# Patient Record
Sex: Male | Born: 1960 | Race: White | Hispanic: No | Marital: Single | Smoking: Former smoker
Health system: Southern US, Community
[De-identification: ages and names within clinical notes are randomized; demographics above are authoritative.]

## PROBLEM LIST (undated history)

## (undated) DIAGNOSIS — C61 Malignant neoplasm of prostate: Secondary | ICD-10-CM

## (undated) DIAGNOSIS — E785 Hyperlipidemia, unspecified: Secondary | ICD-10-CM

## (undated) DIAGNOSIS — G4700 Insomnia, unspecified: Secondary | ICD-10-CM

## (undated) DIAGNOSIS — M19012 Primary osteoarthritis, left shoulder: Secondary | ICD-10-CM

## (undated) DIAGNOSIS — M19011 Primary osteoarthritis, right shoulder: Secondary | ICD-10-CM

## (undated) DIAGNOSIS — F419 Anxiety disorder, unspecified: Secondary | ICD-10-CM

## (undated) DIAGNOSIS — L719 Rosacea, unspecified: Secondary | ICD-10-CM

## (undated) DIAGNOSIS — K219 Gastro-esophageal reflux disease without esophagitis: Secondary | ICD-10-CM

## (undated) DIAGNOSIS — H269 Unspecified cataract: Secondary | ICD-10-CM

## (undated) HISTORY — DX: Rosacea, unspecified: L71.9

## (undated) HISTORY — DX: Unspecified cataract: H26.9

## (undated) HISTORY — PX: EYE MUSCLE SURGERY: SHX370

## (undated) HISTORY — PX: POLYPECTOMY: SHX149

## (undated) HISTORY — DX: Insomnia, unspecified: G47.00

## (undated) HISTORY — DX: Primary osteoarthritis, left shoulder: M19.012

## (undated) HISTORY — DX: Primary osteoarthritis, right shoulder: M19.011

## (undated) HISTORY — PX: COLONOSCOPY: SHX174

## (undated) HISTORY — DX: Gastro-esophageal reflux disease without esophagitis: K21.9

## (undated) HISTORY — DX: Hyperlipidemia, unspecified: E78.5

## (undated) HISTORY — PX: PROSTATE BIOPSY: SHX241

## (undated) HISTORY — DX: Anxiety disorder, unspecified: F41.9

---

## 1993-02-28 HISTORY — PX: INGUINAL HERNIA REPAIR: SHX194

## 2011-07-14 ENCOUNTER — Encounter: Payer: Self-pay | Admitting: Gastroenterology

## 2011-08-12 ENCOUNTER — Ambulatory Visit (AMBULATORY_SURGERY_CENTER): Payer: 59 | Admitting: *Deleted

## 2011-08-12 ENCOUNTER — Encounter: Payer: Self-pay | Admitting: Gastroenterology

## 2011-08-12 VITALS — Ht 72.0 in | Wt 173.0 lb

## 2011-08-12 DIAGNOSIS — Z1211 Encounter for screening for malignant neoplasm of colon: Secondary | ICD-10-CM

## 2011-08-12 MED ORDER — MOVIPREP 100 G PO SOLR: ORAL | Status: DC

## 2011-08-26 ENCOUNTER — Encounter: Payer: Self-pay | Admitting: Gastroenterology

## 2011-08-26 ENCOUNTER — Ambulatory Visit (AMBULATORY_SURGERY_CENTER): Payer: 59 | Admitting: Gastroenterology

## 2011-08-26 VITALS — BP 124/71 | HR 64 | Temp 97.6°F | Resp 18 | Ht 72.0 in | Wt 173.0 lb

## 2011-08-26 DIAGNOSIS — D126 Benign neoplasm of colon, unspecified: Secondary | ICD-10-CM

## 2011-08-26 DIAGNOSIS — Z1211 Encounter for screening for malignant neoplasm of colon: Secondary | ICD-10-CM

## 2011-08-26 MED ORDER — SODIUM CHLORIDE 0.9 % IV SOLN: 500.0000 mL | INTRAVENOUS | Status: DC

## 2011-08-26 NOTE — Patient Instructions (Addendum)

## 2011-08-26 NOTE — Progress Notes (Signed)
Patient did not experience any of the following events: a burn prior to discharge; a fall within the facility; wrong site/side/patient/procedure/implant event; or a hospital transfer or hospital admission upon discharge from the facility. (G8907) Patient did not have preoperative order for IV antibiotic SSI prophylaxis. (G8918)  

## 2011-08-26 NOTE — Progress Notes (Deleted)
YOU HAD AN ENDOSCOPIC PROCEDURE TODAY AT THE [facility]: Refer to the procedure report that was given to you for any specific questions about what was found during the examination.  If the procedure report does not answer your questions, please call your gastroenterologist to clarify.  If you requested that your care partner not be given the details of your procedure findings, then the procedure report has been included in a sealed envelope for you to review at your convenience later.  YOU SHOULD EXPECT: Some feelings of bloating in the abdomen. Passage of more gas than usual.  Walking can help get rid of the air that was put into your GI tract during the procedure and reduce the bloating. If you had a lower endoscopy (such as a colonoscopy or flexible sigmoidoscopy) you may notice spotting of blood in your stool or on the toilet paper. If you underwent a bowel prep for your procedure, then you may not have a normal bowel movement for a few days.  DIET: Your first meal following the procedure should be a light meal and then it is ok to progress to your normal diet.  A half-sandwich or bowl of soup is an example of a good first meal.  Heavy or fried foods are harder to digest and may make you feel nauseous or bloated.  Likewise meals heavy in dairy and vegetables can cause extra gas to form and this can also increase the bloating.  Drink plenty of fluids but you should avoid alcoholic beverages for 24 hours.  ACTIVITY: Your care partner should take you home directly after the procedure.  You should plan to take it easy, moving slowly for the rest of the day.  You can resume normal activity the day after the procedure however you should NOT DRIVE or use heavy machinery for 24 hours (because of the sedation medicines used during the test).    SYMPTOMS TO REPORT IMMEDIATELY: A gastroenterologist can be reached at any hour.  During normal business hours, 8:30 AM to 5:00 PM Monday through Friday,  call [phone].  After hours and on weekends, please call the GI answering service at [phone] who will take a message and have the physician on call contact you.   Following lower endoscopy (colonoscopy or flexible sigmoidoscopy):  Excessive amounts of blood in the stool  Significant tenderness or worsening of abdominal pains  Swelling of the abdomen that is new, acute  Fever of 100F or higher   FOLLOW UP: If any biopsies were taken you will be contacted by phone or by letter within the next 1-3 weeks.  Call your gastroenterologist if you have not heard about the biopsies in 3 weeks.  Our staff will call the home number listed on your records the next business day following your procedure to check on you and address any questions or concerns that you may have at that time regarding the information given to you following your procedure. This is a courtesy call and so if there is no answer at the home number and we have not heard from you through the emergency physician on call, we will assume that you have returned to your regular daily activities without incident.  SIGNATURES/CONFIDENTIALITY: You and/or your care partner have signed paperwork which will be entered into your electronic medical record.  These signatures attest to the fact that that the information above on your After Visit Summary has been reviewed and is understood.  Full responsibility of the confidentiality of   this discharge information lies with you and/or your care-partner.   Resume your normal medications 

## 2011-08-26 NOTE — Op Note (Signed)
[facility] [address] [place], [place]  [address]  COLONOSCOPY PROCEDURE REPORT  PATIENT:  Charles Noble, Charles Noble  MR#:  [id number] BIRTHDATE:  1960-05-04, 51 yrs. old  GENDER:  male ENDOSCOPIST:  Barbette Hair. Arlyce Dice, MD REF. BY:  Jarome Matin, M.D. PROCEDURE DATE:  08/26/2011 PROCEDURE:  Colonoscopy with snare polypectomy ASA CLASS:  Class I INDICATIONS:  Routine Risk Screening MEDICATIONS:   MAC sedation, administered by CRNA propofol 400mg IV  DESCRIPTION OF PROCEDURE:   After the risks benefits and alternatives of the procedure were thoroughly explained, informed consent was obtained.  Digital rectal exam was performed and revealed no abnormalities.   The LB CF-[id number] endoscope was introduced through the anus and advanced to the cecum, which was identified by both the appendix and ileocecal valve, without limitations.  The quality of the prep was excellent, using MiraLax.  The instrument was then slowly withdrawn as the colon was fully examined. <<PROCEDUREIMAGES>>  FINDINGS:  A sessile polyp was found in the descending colon. It was 5 mm in size. Polyp was snared without cautery. Retrieval was successful (see image3). snare polyp  A sessile polyp was found in the sigmoid colon. It was 5 mm in size. It was found 20 cm from the point of entry. Polyp was snared without cautery. Retrieval was successful (see image6). snare polyp  This was otherwise a normal examination of the colon (see image1 and image7). Retroflexed views in the rectum revealed no abnormalities.    The time to cecum =  1) 4.25  minutes. The scope was then withdrawn in 1) 8.25  minutes from the cecum and the procedure completed. COMPLICATIONS:  None ENDOSCOPIC IMPRESSION: 1) 5 mm sessile polyp in the descending colon 2) 5 mm sessile polyp in the sigmoid colon 3) Otherwise normal examination RECOMMENDATIONS: 1) If the polyp(s) removed today are proven to be adenomatous (pre-cancerous) polyps,  you will need a repeat colonoscopy in 5 years. Otherwise you should continue to follow colorectal cancer screening guidelines for "routine risk" patients with colonoscopy in 10 years. You will receive a letter within 1-2 weeks with the results of your biopsy as well as final recommendations. Please call my office if you have not received a letter after 3 weeks. REPEAT EXAM:   You will receive a letter from Dr. Arlyce Dice in 1-2 weeks, after reviewing the final pathology, with followup recommendations.  ______________________________ Barbette Hair Arlyce Dice, MD  CC:  n. eSIGNED:   Barbette Hair. Derwood Becraft at 08/26/2011 11:13 AM  Eveline Keto, [phone]

## 2011-08-26 NOTE — Progress Notes (Signed)
The pt tolerated the colonoscopy very well. Maw   

## 2011-08-29 ENCOUNTER — Telehealth: Payer: Self-pay | Admitting: *Deleted

## 2011-08-29 NOTE — Telephone Encounter (Signed)
  Follow up Call-  Call back number 08/26/2011  Post procedure Call Back phone  # [phone]  Permission to leave phone message Yes     No answer,left message.

## 2011-09-05 ENCOUNTER — Encounter: Payer: Self-pay | Admitting: Gastroenterology

## 2011-09-07 ENCOUNTER — Encounter: Payer: Self-pay | Admitting: *Deleted

## 2012-02-29 HISTORY — PX: SHOULDER SURGERY: SHX246

## 2016-03-31 DIAGNOSIS — Z0000 Encounter for general adult medical examination without abnormal findings: Secondary | ICD-10-CM | POA: Diagnosis not present

## 2016-04-05 DIAGNOSIS — R972 Elevated prostate specific antigen [PSA]: Secondary | ICD-10-CM | POA: Diagnosis not present

## 2016-04-05 DIAGNOSIS — Z0000 Encounter for general adult medical examination without abnormal findings: Secondary | ICD-10-CM | POA: Diagnosis not present

## 2016-04-05 DIAGNOSIS — Z1389 Encounter for screening for other disorder: Secondary | ICD-10-CM | POA: Diagnosis not present

## 2016-04-11 DIAGNOSIS — Z1212 Encounter for screening for malignant neoplasm of rectum: Secondary | ICD-10-CM | POA: Diagnosis not present

## 2016-06-13 ENCOUNTER — Encounter: Payer: Self-pay | Admitting: Gastroenterology

## 2016-06-30 DIAGNOSIS — H25813 Combined forms of age-related cataract, bilateral: Secondary | ICD-10-CM | POA: Diagnosis not present

## 2016-06-30 DIAGNOSIS — H353121 Nonexudative age-related macular degeneration, left eye, early dry stage: Secondary | ICD-10-CM | POA: Diagnosis not present

## 2016-06-30 DIAGNOSIS — H35412 Lattice degeneration of retina, left eye: Secondary | ICD-10-CM | POA: Diagnosis not present

## 2016-08-01 DIAGNOSIS — H2511 Age-related nuclear cataract, right eye: Secondary | ICD-10-CM | POA: Diagnosis not present

## 2016-08-08 ENCOUNTER — Ambulatory Visit (AMBULATORY_SURGERY_CENTER): Payer: Self-pay | Admitting: *Deleted

## 2016-08-08 VITALS — Ht 72.0 in | Wt 178.0 lb

## 2016-08-08 DIAGNOSIS — Z86010 Personal history of colonic polyps: Secondary | ICD-10-CM

## 2016-08-08 MED ORDER — NA SULFATE-K SULFATE-MG SULF 17.5-3.13-1.6 GM/177ML PO SOLN: 1.0000 | Freq: Once | ORAL | 0 refills | Status: AC

## 2016-08-08 NOTE — Progress Notes (Signed)
No egg or soy allergy known to patient  No issues with past sedation with any surgeries  or procedures, no intubation problems  No diet pills per patient No home 02 use per patient  No blood thinners per patient  Pt denies issues with constipation  No A fib or A flutter  EMMI video declined-  15$ coupon to pt for suprep

## 2016-08-12 ENCOUNTER — Encounter: Payer: Self-pay | Admitting: Gastroenterology

## 2016-08-15 ENCOUNTER — Encounter: Payer: Self-pay | Admitting: Gastroenterology

## 2016-08-15 DIAGNOSIS — H25811 Combined forms of age-related cataract, right eye: Secondary | ICD-10-CM | POA: Diagnosis not present

## 2016-08-15 DIAGNOSIS — H2511 Age-related nuclear cataract, right eye: Secondary | ICD-10-CM | POA: Diagnosis not present

## 2016-08-26 ENCOUNTER — Encounter: Payer: Self-pay | Admitting: Gastroenterology

## 2016-08-26 ENCOUNTER — Ambulatory Visit (AMBULATORY_SURGERY_CENTER): Payer: 59 | Admitting: Gastroenterology

## 2016-08-26 VITALS — BP 115/72 | HR 72 | Temp 98.0°F | Resp 20 | Ht 72.0 in | Wt 178.0 lb

## 2016-08-26 DIAGNOSIS — Z86010 Personal history of colonic polyps: Secondary | ICD-10-CM | POA: Diagnosis not present

## 2016-08-26 DIAGNOSIS — D123 Benign neoplasm of transverse colon: Secondary | ICD-10-CM | POA: Diagnosis not present

## 2016-08-26 DIAGNOSIS — D122 Benign neoplasm of ascending colon: Secondary | ICD-10-CM | POA: Diagnosis not present

## 2016-08-26 DIAGNOSIS — Z1211 Encounter for screening for malignant neoplasm of colon: Secondary | ICD-10-CM | POA: Diagnosis not present

## 2016-08-26 MED ORDER — SODIUM CHLORIDE 0.9 % IV SOLN: 500.0000 mL | INTRAVENOUS | Status: AC

## 2016-08-26 NOTE — Progress Notes (Signed)
Called to room to assist during endoscopic procedure.  Patient ID and intended procedure confirmed with present staff. Received instructions for my participation in the procedure from the performing physician.  

## 2016-08-26 NOTE — Patient Instructions (Signed)
   INFORMATION ON POLYPS,DIVERTICULOSIS,AND HEMORRHOIDS GIVEN TO YOU TODAY.  AWAIT PATHOLOGY RESULTS IN A LETTER FROM DR NANDIGAM    YOU HAD AN ENDOSCOPIC PROCEDURE TODAY AT [facility]:   Refer to the procedure report that was given to you for any specific questions about what was found during the examination.  If the procedure report does not answer your questions, please call your gastroenterologist to clarify.  If you requested that your care partner not be given the details of your procedure findings, then the procedure report has been included in a sealed envelope for you to review at your convenience later.  YOU SHOULD EXPECT: Some feelings of bloating in the abdomen. Passage of more gas than usual.  Walking can help get rid of the air that was put into your GI tract during the procedure and reduce the bloating. If you had a lower endoscopy (such as a colonoscopy or flexible sigmoidoscopy) you may notice spotting of blood in your stool or on the toilet paper. If you underwent a bowel prep for your procedure, you may not have a normal bowel movement for a few days.  Please Note:  You might notice some irritation and congestion in your nose or some drainage.  This is from the oxygen used during your procedure.  There is no need for concern and it should clear up in a day or so.  SYMPTOMS TO REPORT IMMEDIATELY:   Following lower endoscopy (colonoscopy or flexible sigmoidoscopy):  Excessive amounts of blood in the stool  Significant tenderness or worsening of abdominal pains  Swelling of the abdomen that is new, acute  Fever of 100F or higher    For urgent or emergent issues, a gastroenterologist can be reached at any hour by calling [phone].   DIET:  We do recommend a small meal at first, but then you may proceed to your regular diet.  Drink plenty of fluids but you should avoid alcoholic beverages for 24 hours.  ACTIVITY:  You should plan to take it easy for  the rest of today and you should NOT DRIVE or use heavy machinery until tomorrow (because of the sedation medicines used during the test).    FOLLOW UP: Our staff will call the number listed on your records the next business day following your procedure to check on you and address any questions or concerns that you may have regarding the information given to you following your procedure. If we do not reach you, we will leave a message.  However, if you are feeling well and you are not experiencing any problems, there is no need to return our call.  We will assume that you have returned to your regular daily activities without incident.  If any biopsies were taken you will be contacted by phone or by letter within the next 1-3 weeks.  Please call us at [phone] if you have not heard about the biopsies in 3 weeks.    SIGNATURES/CONFIDENTIALITY: You and/or your care partner have signed paperwork which will be entered into your electronic medical record.  These signatures attest to the fact that that the information above on your After Visit Summary has been reviewed and is understood.  Full responsibility of the confidentiality of this discharge information lies with you and/or your care-partner.

## 2016-08-26 NOTE — Progress Notes (Signed)
Pt's states no medical or surgical changes since previsit or office visit. 

## 2016-08-26 NOTE — Progress Notes (Signed)
A/ox3 pleased with MAC, report to [facility]

## 2016-08-26 NOTE — Op Note (Signed)
[facility] Patient Name: Charles Noble Procedure Date: 08/26/2016 10:24 AM MRN: [id number] Endoscopist: Mauri Pole , MD Age: 56 Referring MD:  Date of Birth: [birth year] Gender: Male Account #: [id number] Procedure:                Colonoscopy Indications:              High risk colon cancer surveillance: Personal                            history of adenoma less than 10 mm in size Medicines:                Monitored Anesthesia Care Procedure:                Pre-Anesthesia Assessment:                           - Prior to the procedure, a History and Physical                            was performed, and patient medications and                            allergies were reviewed. The patient's tolerance of                            previous anesthesia was also reviewed. The risks                            and benefits of the procedure and the sedation                            options and risks were discussed with the patient.                            All questions were answered, and informed consent                            was obtained. Prior Anticoagulants: The patient has                            taken no previous anticoagulant or antiplatelet                            agents. ASA Grade Assessment: II - A patient with                            mild systemic disease. After reviewing the risks                            and benefits, the patient was deemed in                            satisfactory condition to undergo the procedure.  After obtaining informed consent, the colonoscope                            was passed under direct vision. Throughout the                            procedure, the patient's blood pressure, pulse, and                            oxygen saturations were monitored continuously. The                            Colonoscope was introduced through the anus and                            advanced to the the  cecum, identified by                            appendiceal orifice and ileocecal valve. The                            colonoscopy was performed without difficulty. The                            patient tolerated the procedure well. The quality                            of the bowel preparation was excellent. The                            ileocecal valve, appendiceal orifice, and rectum                            were photographed. Scope In: 10:44:27 AM Scope Out: 11:05:50 AM Scope Withdrawal Time: 0 hours 12 minutes 16 seconds  Total Procedure Duration: 0 hours 21 minutes 23 seconds  Findings:                 The perianal and digital rectal examinations were                            normal.                           A 1 mm polyp was found in the ascending colon. The                            polyp was sessile. The polyp was removed with a                            cold biopsy forceps. Resection and retrieval were                            complete.  A 5 mm polyp was found in the transverse colon. The                            polyp was sessile. The polyp was removed with a                            cold snare. Resection and retrieval were complete.                           Multiple small-mouthed diverticula were found in                            the sigmoid colon and descending colon.                           Non-bleeding internal hemorrhoids were found during                            retroflexion. The hemorrhoids were small. Complications:            No immediate complications. Estimated Blood Loss:     Estimated blood loss was minimal. Impression:               - One 1 mm polyp in the ascending colon, removed                            with a cold biopsy forceps. Resected and retrieved.                           - One 5 mm polyp in the transverse colon, removed                            with a cold snare. Resected and retrieved.                            - Diverticulosis in the sigmoid colon and in the                            descending colon.                           - Non-bleeding internal hemorrhoids. Recommendation:           - Patient has a contact number available for                            emergencies. The signs and symptoms of potential                            delayed complications were discussed with the                            patient. Return to normal activities tomorrow.  Written discharge instructions were provided to the                            patient.                           - Resume previous diet.                           - Continue present medications.                           - Await pathology results.                           - Repeat colonoscopy in 5 years for surveillance                            based on pathology results. Mauri Pole, MD 08/26/2016 11:09:53 AM This report has been signed electronically.

## 2016-08-29 ENCOUNTER — Telehealth: Payer: Self-pay | Admitting: *Deleted

## 2016-08-29 NOTE — Telephone Encounter (Signed)
Left message on f/u call 

## 2016-08-29 NOTE — Telephone Encounter (Signed)
No answer, message left for the patient. 

## 2016-08-30 ENCOUNTER — Encounter: Payer: Self-pay | Admitting: Gastroenterology

## 2016-09-20 ENCOUNTER — Encounter: Payer: Self-pay | Admitting: Neurology

## 2016-09-21 ENCOUNTER — Encounter: Payer: Self-pay | Admitting: Neurology

## 2016-09-21 ENCOUNTER — Ambulatory Visit (INDEPENDENT_AMBULATORY_CARE_PROVIDER_SITE_OTHER): Payer: 59 | Admitting: Neurology

## 2016-09-21 VITALS — BP 122/78 | HR 68 | Ht 72.0 in | Wt 177.0 lb

## 2016-09-21 DIAGNOSIS — R4189 Other symptoms and signs involving cognitive functions and awareness: Secondary | ICD-10-CM | POA: Diagnosis not present

## 2016-09-21 DIAGNOSIS — G4709 Other insomnia: Secondary | ICD-10-CM | POA: Diagnosis not present

## 2016-09-21 NOTE — Progress Notes (Signed)
SLEEP MEDICINE CLINIC   Provider:  Larey Seat, M D  Primary Care Physician:  Leanna Battles, MD   Referring Provider: Leanna Battles, MD    Chief Complaint  Patient presents with  . New Patient (Initial Visit)    HPI:  Charles Noble is a 56 y.o. male , seen here as in a referral/ revisit  from Dr. Philip Aspen for a sleep evaluation.   Charles Noble reports that he has had difficulties initiating sleep even as a young man and maybe even in childhood. And during his younger years he had no problems compensating for it. He could sleep longer on weekends and to sleep was uninterrupted are unfragmented once initiated. Over the last couple of years he has woken up often between 3:34 AM and finds himself not able to easily go back to sleep, the rest of the night is often spent in a fitful nonrestorative sleep quality. Charles Noble has also recently been referred for evaluation of adult ADHD or ADD, and he seems to agree that this may be present. He was tried on extended-release stimulants in 2 different forms, but the most recent prescribed medication is generic Ritalin 10 mg offered as twice a day medication. He has not taken it in over a month but he has noted that he is more focused and he loses weight and on this medication.   Chief complaint according to patient : Insomnia- getting 4 hours of sleep and than "little sleep " at all.   Sleep habits are as follows: The patient's desired bedtime is around 10:30 PM, he reads on his kindle , and is asleep within 20-30 minutes. The benefit is that he can enlarge the fond. He takes Ambien or Lunesta 3 mg, Zolpidem 1/ 2 of 10 mg.Asleep sleep about 11:00 he will be waking up frequently after the time of 2.30 or 3.30 - after 3 hours of sleep. He has no parasomnia history. No nocturia. Is not sure if he dreams but dreamt very active dreams on Chantix. The bedroom is described as cool, quiet and dark, he is moving frequently, also indicated by his fit bit.  He falls asleep on his side but wakes up on his back. He has not been told that he snores- sleeps alone mostly. He rises at 6 AM, alert , without alarm. Sleep better after Ambien, more groggy after Lunesta.  He tried OTC, Benadryl, Melatonin and Unisom. He does not take daytime naps.     Sleep medical history and family sleep history: older sister has insomnia on [place], mother took [place]. Maternal family history of depression, affecting the patient himself.   Social history:  Single , childless. Works feels overwhelming, no extracurricular activities. 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  As a student fell asleep in classroom, struggles in meetings now. Smoked on and off- currently 2 month off . Drinks some alcohol- wine or beer, usually not hard liquor. Week ends . Caffeine;  All coffee and coke in AM.     Review of Systems: Out of a complete 14 system review, the patient complains of only the following symptoms, and all other reviewed systems are negative.  sleepy on long rides.  shoulder pain on the right - dominant hand. Affected sleep position.  Epworth score  2 Fatigue severity score 48  , depression score 2/15   Social History  Social History  . Marital status: Single    Spouse name: N/A  . Number of children: N/A  . Years of education: N/A   Occupational History  . Not on file.   Social History Main Topics  . Smoking status: Former Smoker    Quit date: 09/29/2010  . Smokeless tobacco: Never Used     Comment: uses e-cigarette- not presently   . Alcohol use 6.0 oz/week    5 Glasses of wine, 5 Cans of beer per week     Comment: social  . Drug use: No  . Sexual activity: Not on file   Other Topics Concern  . Not on file   Social History Narrative  . No narrative on file    Family History  Problem Relation Age of Onset  . Colon polyps  Brother   . Ovarian cancer Mother   . Diabetes Father   . Breast cancer Sister   . Esophageal cancer Neg Hx   . Rectal cancer Neg Hx   . Stomach cancer Neg Hx   . Colon cancer Neg Hx     Past Medical History:  Diagnosis Date  . Anxiety   . Cataract   . GERD (gastroesophageal reflux disease)   . Hyperlipidemia   . Insomnia   . Osteoarthritis of both shoulders   . Rosacea     Past Surgical History:  Procedure Laterality Date  . COLONOSCOPY    . [facility]  . [place]   right  . POLYPECTOMY    . SHOULDER SURGERY  2014    Current Outpatient Prescriptions  Medication Sig Dispense Refill  . acyclovir (ZOVIRAX) 400 MG tablet Take 400 mg by mouth 2 (two) times daily.    Marland Kitchen ALPRAZolam (XANAX) 0.25 MG tablet Take 0.25 mg by mouth at bedtime as needed for anxiety.    Marland Kitchen Besifloxacin HCl (BESIVANCE) 0.6 % SUSP Apply to eye.    . Bromfenac Sodium (PROLENSA) 0.07 % SOLN Apply to eye.    . Difluprednate (DUREZOL) 0.05 % EMUL Apply to eye.    . Eszopiclone 3 MG TABS Take 3 mg by mouth at bedtime. Take immediately before bedtime    . Ibuprofen 200 MG CAPS Take 2 capsules by mouth as needed.    . methylphenidate (RITALIN) 10 MG tablet Take 10 mg by mouth 2 (two) times daily with breakfast and lunch.    . METROGEL 1 % gel Apply 1 application topically Twice daily as needed.    . minocycline (MINOCIN,DYNACIN) 100 MG capsule Take 1 capsule by mouth as needed.    Marland Kitchen omeprazole (PRILOSEC) 20 MG capsule Take 20 mg by mouth daily as needed.    . simvastatin (ZOCOR) 80 MG tablet Take 1 tablet by mouth Daily.    . tadalafil (CIALIS) 10 MG tablet Take 5 mg by mouth daily as needed for erectile dysfunction.     Marland Kitchen zolpidem (AMBIEN) 10 MG tablet Take 1 tablet by mouth At bedtime as needed.     Current Facility-Administered Medications  Medication Dose Route Frequency Provider Last Rate Last Dose  . 0.9 %  sodium chloride infusion  500 mL Intravenous Continuous  Mauri Pole, MD        Allergies as of 09/21/2016  . (No Known Allergies)    Vitals: BP 122/78   Pulse 68   Ht 6' (1.829 m)   Wt 177 lb (80.3 kg)   BMI 24.01 kg/m  Last Weight:  Wt Readings from Last 1 Encounters:  09/21/16 177 lb (80.3 kg)   KDT:OIZT mass index is 24.01 kg/m.     Last Height:   Ht Readings from Last 1 Encounters:  09/21/16 6' (1.829 m)    Physical exam:  General: The patient is awake, alert and appears not in acute distress. The patient is well groomed. Head: Normocephalic, atraumatic. Neck is supple. Mallampati 1-the uvula lifts promptly above the tongue ground no retrognathia is noted, or clicking , used braces in childhood.  neck circumference:16. Nasal airflow - Patent  Cardiovascular:  Regular rate and rhythm, without  murmurs or carotid bruit, and without distended neck veins. Respiratory: Lungs are clear to auscultation. Skin:  Without evidence of edema, or rash Trunk: BMI is 24 . The patient's posture is erect  Neurologic exam : The patient is awake and alert, oriented to place and time.   Attention span & concentration ability appears normal.  Speech is fluent,  without dysarthria, dysphonia or aphasia. Mood and affect are appropriate.  Cranial nerves: Pupils are equal and briskly reactive to light. Funduscopic exam without  evidence of pallor or edema. Status [ost cataract surgery on the right,  Extraocular movements  in vertical and horizontal planes intact and without nystagmus. Visual fields by finger perimetry are intact.Hearing to finger rub intact.  Facial sensation intact to fine touch. Facial motor strength is symmetric and tongue and uvula move midline. Shoulder shrug was symmetrical.   Motor exam:  Normal tone, muscle bulk and symmetric strength in all extremities. Sensory:  Fine touch, pinprick and vibration were tested in all extremities. Proprioception tested in the upper extremities was normal. Coordination: Rapid  alternating movements in the fingers/hands was normal. Finger-to-nose maneuver  normal without evidence of ataxia, dysmetria or tremor. Gait and station: Patient walks without assistive device and is able unassisted to climb up to the exam table. Strength within normal limits.  Stance is stable and normal.   Deep tendon reflexes: in the  upper and lower extremities are symmetric and intact. Babinski maneuver response is downgoing.    Assessment:  After physical and neurologic examination, review of laboratory studies,  Personal review of imaging studies, reports of other /same  Imaging studies, results of polysomnography and / or neurophysiology testing and pre-existing records as far as provided in visit., my assessment is   1)   Insomnia related to stress. He can improve on sleep hygiene. Alcohol reduction.  Ambien to 5 mg 4 days a week- may try trazodone 3 nights a week.    2)   He has no known apnea or even snoring. He has vivid dreams and is not sure if these are med induced. He has not sleepwalked.   3)  I don't think he benefits form a HST- Insomnia cannot be qualified by HST- I would like to witness his arousals in early AM - and see if hey have a physiological reason.    The patient was advised of the nature of the diagnosed disorder , the treatment options and the  risks for general health and wellness arising from not treating the condition.   I spent more than 45 minutes of face to face time with the patient.  Greater than 50% of time was spent in counseling and coordination of care. We have discussed the diagnosis and differential and I answered the patient's questions.    Plan:  Treatment plan and additional workup :  I will ask [employer] to allow  a in lab study.  Larey Seat, MD 2/84/0698, 6:14 AM  Certified in Neurology by ABPN Certified in [place] by [facility] [address], [place], [place] [address]

## 2016-09-27 ENCOUNTER — Telehealth: Payer: Self-pay | Admitting: Neurology

## 2016-09-27 DIAGNOSIS — F5103 Paradoxical insomnia: Secondary | ICD-10-CM

## 2016-09-27 NOTE — Telephone Encounter (Signed)
[employer] denied NPSG suggest HST

## 2016-09-28 ENCOUNTER — Other Ambulatory Visit: Payer: Self-pay | Admitting: Neurology

## 2016-09-28 DIAGNOSIS — G4733 Obstructive sleep apnea (adult) (pediatric): Secondary | ICD-10-CM

## 2016-09-28 NOTE — Addendum Note (Signed)
Addended by: Larey Seat on: 09/28/2016 04:53 PM   Modules accepted: Orders

## 2016-09-28 NOTE — Telephone Encounter (Signed)
Insomnia cannot be evaluated by HST- it can only rule out apnea.

## 2016-10-10 DIAGNOSIS — R972 Elevated prostate specific antigen [PSA]: Secondary | ICD-10-CM | POA: Diagnosis not present

## 2016-10-12 ENCOUNTER — Ambulatory Visit (INDEPENDENT_AMBULATORY_CARE_PROVIDER_SITE_OTHER): Payer: 59 | Admitting: Neurology

## 2016-10-12 DIAGNOSIS — G4733 Obstructive sleep apnea (adult) (pediatric): Secondary | ICD-10-CM | POA: Diagnosis not present

## 2016-10-12 DIAGNOSIS — F5103 Paradoxical insomnia: Secondary | ICD-10-CM

## 2016-10-20 NOTE — Addendum Note (Signed)
Addended by: Larey Seat on: 10/20/2016 06:19 PM   Modules accepted: Orders

## 2016-10-20 NOTE — Procedures (Signed)
[facility] [place],  [id number] NAME:  Charles Noble                 DOB: [birth year] MEDICAL RECORD [id number]   DOS: 10/17/16 REFERRING PHYSICIAN: Leanna Battles, MD STUDY PERFORMED: HST HISTORY: Charles Noble is seen here for insomnia, difficulties initiating sleep, even as a young man and even in childhood. Over the last couple of years he has woken up between 3 and 4 AM and finds himself not able to go back to sleep, the rest of the night is often spent in a fitful nonrestorative sleep quality. Epworth score 2, Fatigue severity score 48 points, depression score 2/15, BMI 23.3   STUDY RESULTS: Total Recording Time: 7h 44 min.  Total Apnea/Hypopnea Index (AHI): 1.6/hr. RDI was 3.5/hr.  Average Oxygen Saturation: 94% Lowest Oxygen Saturation: 89% Average Heart Rate: 58  (48-89 bpm )   IMPRESSION: Mild snoring but no significant sleep apnea noted, no explanation for the patient's insomnia complaint.    RECOMMENDATION: I will ask for an in lab PSG to evaluate nocturnal movements and reason's for arousals. I had requested an in lab study before and will do this again now. IF the PSG in Lab can not find a physiological sleep disorder, we will need to refer to insomnia counseling.  I certify that I have reviewed the raw data recording prior to the issuance of this report in accordance with the standards of the American Academy of Sleep Medicine (AASM). Larey Seat, MD   10-20-2016   [facility], ABPN and ABSM Member of and accredited by AASM

## 2016-10-21 ENCOUNTER — Telehealth: Payer: Self-pay | Admitting: Neurology

## 2016-10-21 NOTE — Telephone Encounter (Signed)
Called to discuss sleep results with the patient. No answer at this time.LVm for pt to return call.

## 2016-10-21 NOTE — Telephone Encounter (Signed)
-----   Message from Larey Seat, MD sent at 10/20/2016  6:19 PM EDT ----- The HST did not reveal any sign of apnea, cardiac arrhythmia or oxygen desaturation of clinical relevance. It does not explain the early morning arousals.   I had requested an in lab study before and will do this again now. It may not be approved by insurance- we will try.   If the PSG in lab can not find a physiological sleep disorder, or if we cannot get the PSG approval, we will need to refer to insomnia counseling. CD  CC to Dr Philip Aspen, please.

## 2016-11-03 NOTE — Telephone Encounter (Signed)
Called patient to go over sleep study results. LVM for the patient.  I will send a copy in the mail if I have not heard back from the patient today.

## 2016-11-08 ENCOUNTER — Telehealth: Payer: Self-pay | Admitting: Neurology

## 2016-11-08 NOTE — Telephone Encounter (Signed)
Called patient to schedule NPSG however, he is waiting to hear the results of the home sleep study before he will move forward.

## 2016-11-08 NOTE — Telephone Encounter (Signed)
Called the patient to give him the HST results. I explained that no apnea was noted but that Dr Brett Fairy would like to bring the patient in for another sleep study due to wanting to assess the nocturnal movements. Pt was agreeable and has stated that he will come in for sleep study. Pt will await sleep lab to contact him.

## 2016-11-17 ENCOUNTER — Telehealth: Payer: Self-pay | Admitting: Neurology

## 2016-11-17 NOTE — Telephone Encounter (Signed)
We have attempted to call the patient 3 times to schedule sleep study. Patient has been unavailable at the phone numbers we have on file and has not returned our calls. At this point we will send a letter asking pt to please contact the sleep lab to schedule their sleep study. If patient calls back we will schedule them for their sleep study. ° °

## 2016-12-06 ENCOUNTER — Ambulatory Visit (INDEPENDENT_AMBULATORY_CARE_PROVIDER_SITE_OTHER): Payer: 59 | Admitting: Neurology

## 2016-12-06 DIAGNOSIS — F5103 Paradoxical insomnia: Secondary | ICD-10-CM

## 2016-12-06 DIAGNOSIS — G4761 Periodic limb movement disorder: Secondary | ICD-10-CM

## 2016-12-13 NOTE — Procedures (Signed)
PATIENT'S NAME:  Charles Noble, Charles Noble DOB:      Mar 01, 1960      MR#:    [id number]     DATE OF RECORDING: 12/06/2016 REFERRING M.D.:  Leanna Battles MD Study Performed:   Baseline Polysomnogram HISTORY:  Pt had a HST on 10/17/16 with results of mild snoring but no significant sleep apnea noted, no explanation for the patient's insomnia complaint. PSG to evaluate nocturnal movements and reasons for arousals. If this PSG in Lab cannot find a physiological sleep disorder, we will need to refer to insomnia directed cognitive behavior therapy.   Charles Noble is seen here for insomnia, difficulties initiating sleep, even as a young man and even in childhood. Over the last couple of years he has woken up between 3 and 4 AM and finds himself not able to go back to sleep, the rest of the night is often spent in a fitful nonrestorative sleep quality.  The patient endorsed the Epworth Sleepiness Scale at 2 points. FSS at 48. The patient's weight 177 pounds with a height of 72 (inches), resulting in a BMI of 23.9 kg/m2.The patient's neck circumference measured 16 inches.  CURRENT MEDICATIONS: Zovirax; Xanax; Besivance eye; Prolensa; Durezol; Lunesta ; Ritalin; Minocycline; Prilosec; Zocor; Cialis; Ambien   PROCEDURE:  This is a multichannel digital polysomnogram utilizing the Somnostar 11.2 system.  Electrodes and sensors were applied and monitored per AASM Specifications.   EEG, EOG, Chin and Limb EMG, were sampled at 200 Hz.  ECG, Snore and Nasal Pressure, Thermal Airflow, Respiratory Effort, CPAP Flow and Pressure, Oximetry was sampled at 50 Hz. Digital video and audio were recorded.      BASELINE STUDY Lights Out was at 22:39 and Lights On at 05:11.  Total recording time (TRT) was 392 minutes, with a total sleep time (TST) of 362 minutes. The patient's sleep latency was 30 minutes.  REM latency was 2.5 minutes.  The sleep efficiency was 92.3 %.     SLEEP ARCHITECTURE: WASO (Wake after sleep onset) was 3.5 minutes.   There were 6 minutes in Stage N1, 250.5 minutes Stage N2, 26 minutes Stage N3 and 79.5 minutes in Stage REM.  The percentage of Stage N1 was 1.7%, Stage N2 was 69.2%, Stage N3 was 7.2% and Stage R (REM sleep) was 22.%.   RESPIRATORY ANALYSIS:  There were a total of 19 respiratory events:  14 obstructive apneas, 0 central apneas and 0 mixed apneas with a total of 14 apneas and an apnea index (AI) of 2.3 /hour. There were 5 hypopneas with a hypopnea index of .8 /hour. The patient also had 0 respiratory event related arousals (RERAs).     The total APNEA/HYPOPNEA INDEX (AHI) was 3.1/hour and the total RESPIRATORY DISTURBANCE INDEX was 3.1 /hour.  19 events occurred in REM sleep and 0 events in NREM. The REM AHI was 14.3 /hour, versus a non-REM AHI of 0. The patient spent 133.5 minutes of total sleep time in the supine position and 229 minutes in non-supine. The supine AHI was 5.8 versus a non-supine AHI of 1.6.  OXYGEN SATURATION & C02:  The Wake baseline 02 saturation was 94%, with the lowest being 78%. Time spent below 89% saturation equaled 3 minutes.   PERIODIC LIMB MOVEMENTS:   The patient had a total of 96 Periodic Limb Movements.  The Periodic Limb Movement (PLM) index was 15.9 and the PLM Arousal index was 2.5/hour. The arousals were noted as: 73 were spontaneous, 15 were associated with PLMs, and 4 were  associated with respiratory events.    Audio and video analysis did not show any abnormal or unusual movements, behaviors, phonations or vocalizations.  No nocturia, no parasomnia. Snoring was noted. EKG was in keeping with normal sinus rhythm (NSR). Post-study, the patient indicated that sleep was the same as usual.    IMPRESSION: sustained, uninterrupted sleep with normal REM sleep architecture.   1. Mild Periodic Limb Movement Disorder (PLMD) 2. Primary Snoring  RECOMMENDATIONS:  1. There were periodic limb movements of sleep (PLMS) but rarely associated with sleep disruption.   Consider treating the PLMS primarily.  Correlate clinically for a history consistent with regarding restless legs syndrome (RLS).    Consider secondary restless legs syndrome.  .   2. A dedicated sleep psychology referral is indicated for sleep perception disorder. No objective evidence of  insomnia during sleep study.   3. A follow up appointment will be scheduled in the Sleep Clinic at [facility]. The referring provider will be notified of the results.      I certify that I have reviewed the entire raw data recording prior to the issuance of this report in accordance with the Standards of Accreditation of the American Academy of Sleep Medicine (AASM)      Larey Seat, MD    12-13-2016  Diplomat, American Board of Psychiatry and Neurology  Diplomat, American Board of [place] Director, [facility]

## 2016-12-14 ENCOUNTER — Telehealth: Payer: Self-pay | Admitting: Neurology

## 2016-12-14 DIAGNOSIS — B078 Other viral warts: Secondary | ICD-10-CM | POA: Diagnosis not present

## 2016-12-14 DIAGNOSIS — L719 Rosacea, unspecified: Secondary | ICD-10-CM | POA: Diagnosis not present

## 2016-12-14 NOTE — Telephone Encounter (Signed)
-----   Message from Larey Seat, MD sent at 12/13/2016  5:54 PM EDT ----- Sustained sleep with some respiratory events during REM sleep. Normal sleep architecture with  sufficient REM sleep was noted. Patient slept until 5. 00 AM. Mild PLMs - only in the first 2 hours of sleep and not in REM sleep.  We can try to reduce PLMs, but mainly need to explain that this is a fairly normal sleep pattern. Not insomnia.  CD

## 2016-12-14 NOTE — Telephone Encounter (Signed)
Called patient to discuss sleep study results. No answer at this time. LVM for patient to return call

## 2016-12-22 ENCOUNTER — Other Ambulatory Visit: Payer: Self-pay | Admitting: Neurology

## 2016-12-22 DIAGNOSIS — G4700 Insomnia, unspecified: Secondary | ICD-10-CM

## 2016-12-22 NOTE — Addendum Note (Signed)
Addended by: Darleen Crocker on: 12/22/2016 10:12 AM   Modules accepted: Orders

## 2016-12-22 NOTE — Telephone Encounter (Signed)
Pt returned call to me and we discussed that his sleep study showed a normal sleep pattern. Pt did take his sleep medications which was helpful in providing him with a 92% sleep efficiency. The pt states that he needed this sleep study to prove the need for his sleep medications. I informed him that the study can not prove the need for those medications it basically just shows that he slept well with no complications during the study. I offered the cognitive behavior therapy referral for the patient. The patient would like to proceed with the referral.

## 2017-01-24 DIAGNOSIS — H353121 Nonexudative age-related macular degeneration, left eye, early dry stage: Secondary | ICD-10-CM | POA: Diagnosis not present

## 2017-01-24 DIAGNOSIS — H25812 Combined forms of age-related cataract, left eye: Secondary | ICD-10-CM | POA: Diagnosis not present

## 2017-01-24 DIAGNOSIS — H35412 Lattice degeneration of retina, left eye: Secondary | ICD-10-CM | POA: Diagnosis not present

## 2017-01-30 DIAGNOSIS — H2512 Age-related nuclear cataract, left eye: Secondary | ICD-10-CM | POA: Diagnosis not present

## 2017-02-08 DIAGNOSIS — H25812 Combined forms of age-related cataract, left eye: Secondary | ICD-10-CM | POA: Diagnosis not present

## 2017-02-08 DIAGNOSIS — H2512 Age-related nuclear cataract, left eye: Secondary | ICD-10-CM | POA: Diagnosis not present

## 2017-04-06 DIAGNOSIS — Z0000 Encounter for general adult medical examination without abnormal findings: Secondary | ICD-10-CM | POA: Diagnosis not present

## 2017-04-06 DIAGNOSIS — E298 Other testicular dysfunction: Secondary | ICD-10-CM | POA: Diagnosis not present

## 2017-04-06 DIAGNOSIS — R82998 Other abnormal findings in urine: Secondary | ICD-10-CM | POA: Diagnosis not present

## 2017-04-13 DIAGNOSIS — E782 Mixed hyperlipidemia: Secondary | ICD-10-CM | POA: Diagnosis not present

## 2017-04-13 DIAGNOSIS — R972 Elevated prostate specific antigen [PSA]: Secondary | ICD-10-CM | POA: Diagnosis not present

## 2017-04-13 DIAGNOSIS — E298 Other testicular dysfunction: Secondary | ICD-10-CM | POA: Diagnosis not present

## 2017-04-13 DIAGNOSIS — Z1389 Encounter for screening for other disorder: Secondary | ICD-10-CM | POA: Diagnosis not present

## 2017-04-13 DIAGNOSIS — Z0000 Encounter for general adult medical examination without abnormal findings: Secondary | ICD-10-CM | POA: Diagnosis not present

## 2017-04-20 DIAGNOSIS — Z1212 Encounter for screening for malignant neoplasm of rectum: Secondary | ICD-10-CM | POA: Diagnosis not present

## 2017-10-05 DIAGNOSIS — H26493 Other secondary cataract, bilateral: Secondary | ICD-10-CM | POA: Diagnosis not present

## 2017-10-05 DIAGNOSIS — H35412 Lattice degeneration of retina, left eye: Secondary | ICD-10-CM | POA: Diagnosis not present

## 2017-10-05 DIAGNOSIS — H353121 Nonexudative age-related macular degeneration, left eye, early dry stage: Secondary | ICD-10-CM | POA: Diagnosis not present

## 2017-10-26 NOTE — Addendum Note (Signed)
Addended by: Darleen Crocker on: 10/26/2017 02:57 PM   Modules accepted: Orders

## 2018-03-07 DIAGNOSIS — D3709 Neoplasm of uncertain behavior of other specified sites of the oral cavity: Secondary | ICD-10-CM | POA: Diagnosis not present

## 2018-03-12 DIAGNOSIS — K1329 Other disturbances of oral epithelium, including tongue: Secondary | ICD-10-CM | POA: Diagnosis not present

## 2018-04-09 DIAGNOSIS — R82998 Other abnormal findings in urine: Secondary | ICD-10-CM | POA: Diagnosis not present

## 2018-04-09 DIAGNOSIS — Z0000 Encounter for general adult medical examination without abnormal findings: Secondary | ICD-10-CM | POA: Diagnosis not present

## 2018-04-11 DIAGNOSIS — Z1212 Encounter for screening for malignant neoplasm of rectum: Secondary | ICD-10-CM | POA: Diagnosis not present

## 2018-04-16 DIAGNOSIS — Z23 Encounter for immunization: Secondary | ICD-10-CM | POA: Diagnosis not present

## 2018-04-16 DIAGNOSIS — Z0000 Encounter for general adult medical examination without abnormal findings: Secondary | ICD-10-CM | POA: Diagnosis not present

## 2018-04-16 DIAGNOSIS — R972 Elevated prostate specific antigen [PSA]: Secondary | ICD-10-CM | POA: Diagnosis not present

## 2018-04-16 DIAGNOSIS — E782 Mixed hyperlipidemia: Secondary | ICD-10-CM | POA: Diagnosis not present

## 2018-04-16 DIAGNOSIS — E291 Testicular hypofunction: Secondary | ICD-10-CM | POA: Diagnosis not present

## 2018-06-12 DIAGNOSIS — L719 Rosacea, unspecified: Secondary | ICD-10-CM | POA: Diagnosis not present

## 2018-06-12 DIAGNOSIS — B079 Viral wart, unspecified: Secondary | ICD-10-CM | POA: Diagnosis not present

## 2018-12-06 ENCOUNTER — Other Ambulatory Visit: Payer: Self-pay

## 2018-12-06 DIAGNOSIS — Z20822 Contact with and (suspected) exposure to covid-19: Secondary | ICD-10-CM

## 2018-12-07 LAB — NOVEL CORONAVIRUS, NAA: SARS-CoV-2, NAA: NOT DETECTED

## 2018-12-20 ENCOUNTER — Other Ambulatory Visit: Payer: Self-pay

## 2018-12-20 DIAGNOSIS — Z20822 Contact with and (suspected) exposure to covid-19: Secondary | ICD-10-CM

## 2018-12-22 LAB — NOVEL CORONAVIRUS, NAA: SARS-CoV-2, NAA: NOT DETECTED

## 2019-04-10 ENCOUNTER — Ambulatory Visit: Payer: 59 | Attending: Internal Medicine

## 2019-04-10 DIAGNOSIS — Z20822 Contact with and (suspected) exposure to covid-19: Secondary | ICD-10-CM

## 2019-04-11 LAB — NOVEL CORONAVIRUS, NAA: SARS-CoV-2, NAA: NOT DETECTED

## 2019-06-24 ENCOUNTER — Encounter: Payer: Self-pay | Admitting: Radiation Oncology

## 2019-06-24 NOTE — Progress Notes (Signed)
GU Location of Tumor / Histology: prostatic adenocarcinoma  If Prostate Cancer, Gleason Score is (3 + 4) and PSA is (3.02). Prostate volume: 38 grams.   Xaven Breidinger was on exogenous androgens long term to manage fatigue when a subtle bilateral lateral induration was noted. Unfortunately, the patient's PSA began rising.   Biopsies of prostate (if applicable) revealed:    Past/Anticipated interventions by urology, if any: prostate biopsy, referral to Dr. Tammi Klippel to discuss radiotherapy option  Past/Anticipated interventions by medical oncology, if any: no  Weight changes, if any: no  Bowel/Bladder complaints, if any: IPSS 0. SHIM 25. Denies dysuria or hematuria. Denies urinary leakage or incontinence. Reports continued blood in semen since biopsy. Denies any bowel complaints.   Nausea/Vomiting, if any: no  Pain issues, if any:  denies  SAFETY ISSUES:  Prior radiation? denies  Pacemaker/ICD? denies  Possible current pregnancy? no, male patient  Is the patient on methotrexate? denies  Current Complaints / other details:  9 year oldmale. Single. Manager at [employer]. High anxiety. Mother with hx of ovarian ca. Sister with hx of breast ca. Leaning toward active surveillance.

## 2019-06-25 ENCOUNTER — Ambulatory Visit
Admission: RE | Admit: 2019-06-25 | Discharge: 2019-06-25 | Disposition: A | Discharge status: Home / Self Care | Payer: 59 | Source: Ambulatory Visit | Attending: Radiation Oncology | Admitting: Radiation Oncology

## 2019-06-25 ENCOUNTER — Encounter: Payer: Self-pay | Admitting: General Practice

## 2019-06-25 ENCOUNTER — Encounter: Payer: Self-pay | Admitting: Radiation Oncology

## 2019-06-25 VITALS — Ht 72.0 in | Wt 176.0 lb

## 2019-06-25 DIAGNOSIS — C61 Malignant neoplasm of prostate: Secondary | ICD-10-CM | POA: Insufficient documentation

## 2019-06-25 HISTORY — DX: Malignant neoplasm of prostate: C61

## 2019-06-25 NOTE — Progress Notes (Signed)
Radiation Oncology         [phone] ________________________________  Initial Outpatient Consultation - Conducted via Telephone due to current COVID-19 concerns for limiting patient exposure  Name: Charles Noble MRN: [id number]  Date: 06/25/2019  DOB: [birth year]  [id number], [name], MD  Alexis Frock, MD   REFERRING PHYSICIAN: Alexis Frock, MD  DIAGNOSIS: 59 y.o. gentleman with Stage T1c adenocarcinoma of the prostate with Gleason score of 3+4, and PSA of 3.02.    ICD-10-CM   1. Malignant neoplasm of prostate [facility])  [facility] referral to Social Work    HISTORY OF PRESENT ILLNESS: Charles Noble is a 59 y.o. male with a diagnosis of prostate cancer. He was noted to have a rising PSA by his primary care physician, Dr. Philip Aspen.  The PSA was 1.4 in 2019, 2.4 in 2020, and most recently up to 3.02 in 2021.  He has also been on topical testosterone replacement therapy with Androgel prior to diagnosis.  Accordingly, he was referred for evaluation in urology by Dr. Tresa Moore on 04/08/2019,  digital rectal examination was performed at that time revealing very subtle lateral induration without discrete nodules.  The patient proceeded to transrectal ultrasound with 12 biopsies of the prostate on 06/10/2019.  The prostate volume measured 38 cc.  Out of 12 core biopsies, 5 were positive, all on the right side.  The maximum Gleason score was 3+4, and this was seen in the right base and right mid lateral.  Additionally, Gleason 3+3 was seen in the right mid (with PNI), right base lateral (with PNI), and right apex lateral.  The patient reviewed the biopsy results with his urologist and he has kindly been referred today for discussion of potential radiation treatment options.   PREVIOUS RADIATION THERAPY: No  PAST MEDICAL HISTORY:  Past Medical History:  Diagnosis Date  . Anxiety   . Cataract   . GERD (gastroesophageal reflux disease)   . Hyperlipidemia   . Insomnia   . Osteoarthritis of  both shoulders   . Prostate cancer ([place])   . Rosacea       PAST SURGICAL HISTORY: Past Surgical History:  Procedure Laterality Date  . COLONOSCOPY    . [place]  . [place]   right  . POLYPECTOMY    . PROSTATE BIOPSY    . SHOULDER SURGERY  2014    FAMILY HISTORY:  Family History  Problem Relation Age of Onset  . Colon polyps Brother   . Ovarian cancer Mother   . Diabetes Father   . Breast cancer Sister        breast cancer twice  . Cancer Maternal Aunt   . Breast cancer Cousin   . Breast cancer Cousin   . Esophageal cancer Neg Hx   . Rectal cancer Neg Hx   . Stomach cancer Neg Hx   . Colon cancer Neg Hx     SOCIAL HISTORY:  Social History   Socioeconomic History  . Marital status: Single    Spouse name: Not on file  . Number of children: Not on file  . Years of education: Not on file  . Highest education level: Not on file  Occupational History    Comment: manager  Tobacco Use  . Smoking status: Former Smoker    Packs/day: 1.00    Types: Cigarettes    Quit date: 09/29/2010    Years since quitting: 8.7  . Smokeless tobacco: Never Used  . Tobacco  comment: unknown since he would stop and start again  Substance and Sexual Activity  . Alcohol use: Yes    Alcohol/week: 10.0 standard drinks    Types: 5 Glasses of wine, 5 Cans of beer per week    Comment: social  . Drug use: No  . Sexual activity: Yes  Other Topics Concern  . Not on file  Social History Narrative  . Not on file   Social Determinants of Health   Financial Resource Strain:   . Difficulty of Paying Living Expenses:   Food Insecurity:   . Worried About Charity fundraiser in the Last Year:   . Arboriculturist in the Last Year:   Transportation Needs:   . Film/video editor (Medical):   Marland Kitchen Lack of Transportation (Non-Medical):   Physical Activity:   . Days of Exercise per Week:   . Minutes of Exercise per Session:   Stress:   . Feeling of  Stress :   Social Connections:   . Frequency of Communication with Friends and Family:   . Frequency of Social Gatherings with Friends and Family:   . Attends Religious Services:   . Active Member of Clubs or Organizations:   . Attends Archivist Meetings:   Marland Kitchen Marital Status:   Intimate Partner Violence:   . Fear of Current or Ex-Partner:   . Emotionally Abused:   Marland Kitchen Physically Abused:   . Sexually Abused:     ALLERGIES: Patient has no known allergies.  MEDICATIONS:  Current Outpatient Medications  Medication Sig Dispense Refill  . ALPRAZolam (XANAX) 0.25 MG tablet Take 0.25 mg by mouth at bedtime as needed for anxiety.    . ciclopirox (PENLAC) 8 % solution APPLY TO THE AFFECTED AREA ON TOENAIL TWICE DAILY.    Marland Kitchen doxycycline (PERIOSTAT) 20 MG tablet Take 20 mg by mouth 2 (two) times daily.    . methylphenidate (RITALIN) 10 MG tablet Take 10 mg by mouth 2 (two) times daily with breakfast and lunch.    . METROGEL 1 % gel Apply 1 application topically Twice daily as needed.    Marland Kitchen omeprazole (PRILOSEC) 20 MG capsule Take 20 mg by mouth daily as needed.    . simvastatin (ZOCOR) 80 MG tablet Take 1 tablet by mouth Daily.    . tadalafil (CIALIS) 10 MG tablet Take 5 mg by mouth daily as needed for erectile dysfunction.     Marland Kitchen zolpidem (AMBIEN) 10 MG tablet Take 1 tablet by mouth At bedtime as needed.     Current Facility-Administered Medications  Medication Dose Route Frequency Provider Last Rate Last Admin  . 0.9 %  sodium chloride infusion  500 mL Intravenous Continuous Nandigam, Venia Minks, MD        REVIEW OF SYSTEMS:  On review of systems, the patient reports that he is doing well overall. He denies any chest pain, shortness of breath, cough, fevers, chills, night sweats, unintended weight changes. He denies any bowel disturbances, and denies abdominal pain, nausea or vomiting. He denies any new musculoskeletal or joint aches or pains. His IPSS was 0, indicating no urinary  symptoms. His SHIM was 25, indicating he does not have erectile dysfunction. A complete review of systems is obtained and is otherwise negative.    PHYSICAL EXAM:  Wt Readings from Last 3 Encounters:  06/25/19 176 lb (79.8 kg)  09/21/16 177 lb (80.3 kg)  08/26/16 178 lb (80.7 kg)   Temp Readings from Last 3 Encounters:  08/26/16 98 F (36.7 C)  08/26/11 97.6 F (36.4 C) (Tympanic)   BP Readings from Last 3 Encounters:  09/21/16 122/78  08/26/16 115/72  08/26/11 124/71   Pulse Readings from Last 3 Encounters:  09/21/16 68  08/26/16 72  08/26/11 64   Pain Assessment Pain Score: 0-No pain/10  Physical exam not performed in light of telephone consult visit format.   KPS = 100  100 - Normal; no complaints; no evidence of disease. 90   - Able to carry on normal activity; minor signs or symptoms of disease. 80   - Normal activity with effort; some signs or symptoms of disease. 57   - Cares for self; unable to carry on normal activity or to do active work. 60   - Requires occasional assistance, but is able to care for most of his personal needs. 50   - Requires considerable assistance and frequent medical care. 64   - Disabled; requires special care and assistance. 20   - Severely disabled; hospital admission is indicated although death not imminent. 84   - Very sick; hospital admission necessary; active supportive treatment necessary. 10   - Moribund; fatal processes progressing rapidly. 0     - Dead  Karnofsky DA, Abelmann WH, Craver LS and Burchenal JH [phone]) The use of the nitrogen mustards in the palliative treatment of carcinoma: with particular reference to bronchogenic carcinoma Cancer 1 634-56  LABORATORY DATA:  No results found for: WBC, HGB, HCT, MCV, PLT No results found for: NA, K, CL, CO2 No results found for: ALT, AST, GGT, ALKPHOS, BILITOT   RADIOGRAPHY: No results found.    IMPRESSION/PLAN: This visit was conducted via Telephone to spare the patient  unnecessary potential exposure in the healthcare setting during the current COVID-19 pandemic. 1. 59 y.o. gentleman with Stage T1c adenocarcinoma of the prostate with Gleason Score of 3+4, and PSA of 3. We discussed the patient's workup and outlined the nature of prostate cancer in this setting. The patient's T stage, Gleason's score, and PSA put him into the favorable intermediate risk group. Accordingly, he is eligible for a variety of potential treatment options including brachytherapy, 5.5 weeks of external radiation, or prostatectomy. We discussed the available radiation techniques, and focused on the details and logistics of delivery. We discussed and outlined the risks, benefits, short and long-term effects associated with radiotherapy and compared and contrasted these with prostatectomy. We discussed the role of SpaceOAR in reducing the rectal toxicity associated with radiotherapy.  At the end of the conversation, the patient remains undecided regarding his final treatment preference and would like to take additional time to consider his options. He is scheduled to meet back with Dr. Tresa Moore for further discussion on 07/25/19. He intends to make a final decision before that time and has our number to call back with any further questions or should he reach a decision and prefer to move forward with radiotherapy. We will share our discussion with Dr. Tresa Moore and look forward to following along in the care of this very nice gentleman.    Given current concerns for patient exposure during the COVID-19 pandemic, this encounter was conducted via telephone. The patient was notified in advance and was offered a MyChart meeting to allow for face to face communication but unfortunately reported that he did not have the appropriate resources/technology to support such a visit and instead preferred to proceed with telephone consult. The patient has given verbal consent for this type of encounter. The time spent  during this encounter was 120 minutes. The attendants for this meeting include Tyler Pita MD, Ashlyn Bruning PA-C, [place], and patient, Charles Noble. During the encounter, Tyler Pita MD, Ashlyn Bruning PA-C, and scribe, Wilburn Mylar were located at [place].  Patient, Charles Noble was located at home.    Nicholos Johns, PA-C    Tyler Pita, MD  [facility] Direct Dial: [phone]  Fax: [phone] [email or web address]  Skype  LinkedIn  This document serves as a record of services personally performed by Tyler Pita, MD and Freeman Caldron, PA-C. It was created on their behalf by Wilburn Mylar, a trained medical scribe. The creation of this record is based on the scribe's personal observations and the provider's statements to them. This document has been checked and approved by the attending provider.

## 2019-06-25 NOTE — Progress Notes (Signed)
See progress note under physician encounter. 

## 2019-06-26 NOTE — Progress Notes (Signed)
[facility] Psychosocial Distress Screening Clinical Social Work  Clinical Social Work was referred by distress screening protocol.  The patient scored a 5 on the Psychosocial Distress Thermometer which indicates moderate distress. Clinical Social Worker contacted patient by phone to assess for distress and other psychosocial needs. He appreciated meeting w various providers, has clear understanding of his treatment choices, is considering options.  Will be in communication w treatment team.  Is aware of [facility] programming, can contact us as needed for support/resources.     [facility] DISTRESS SCREENING 06/25/2019  Screening Type Initial Screening  Distress experienced in past week (1-10) 5  Practical problem type Work/school  Emotional problem type Nervousness/Anxiety  Referral to support programs No    Clinical Social Worker follow up needed: No.  If yes, follow up plan:  Beverely Pace, [place], LCSW Clinical Social Worker Phone:  [phone]

## 2019-06-27 ENCOUNTER — Telehealth: Payer: Self-pay | Admitting: Radiation Oncology

## 2019-06-27 NOTE — Telephone Encounter (Signed)
Received several voicemail messages from patient today inquiring about scheduling and treatment time frames. Phoned patient back at my first available opportunity.   Patient explains he is leaning toward the radiation option where he gets 28 treatments. Patient questions how quickly he can get started with treatment. He states, "if I can start now I will." Patient explains that June will be very hectic for him and if he can't start until June he may delay treatment until July or August. Patient confirms that May would not be hectic and he could easily present for treatment in May.   Also, patient reports he has his sister's genetic testing records and wants to know if they would be beneficial to Dr. Tammi Klippel.   Patient understands this RN will relay his questions to Dr. Tammi Klippel and phone him back tomorrow with answers.

## 2019-06-28 ENCOUNTER — Telehealth: Payer: Self-pay | Admitting: Radiation Oncology

## 2019-06-28 NOTE — Telephone Encounter (Signed)
Phoned patient back as promised. No answer. Left detailed voicemail message. Explained that if his sister's information showed a mutation a copy of her test would be helpful. Encouraged patient to let me know when he could drop this off and I would meet him at the front of the cancer center so he wouldn't even have to get out of his car. Also, explained that per Dr. Tammi Klippel the speed in which he can start radiation all depends on how quickly [facility] can get his fiducial markers and spaceoar placed. Explained that per Romie Jumper, scheduler, [facility] may potentially have an opening for May 5 or June 16th. Encouraged patient to think about this over the weekend and phone back Monday. Provided my direct number.

## 2019-07-01 ENCOUNTER — Telehealth: Payer: Self-pay | Admitting: *Deleted

## 2019-07-01 ENCOUNTER — Encounter: Payer: Self-pay | Admitting: Medical Oncology

## 2019-07-01 NOTE — Telephone Encounter (Signed)
CALLED PATIENT TO UPDATE, LVM FOR A RETURN CALL 

## 2019-07-01 NOTE — Progress Notes (Signed)
Spoke with patient to introduce myself as the prostate nurse navigator and discuss my role. I was unable to meet him 4/27, when he consulted with Dr. Tammi Klippel. He states the consult went well and has considered radiation.He has lots of questions about radiation and surgery. We discussed his PSA and Gleason score and the pros and cons of radiation and  prostatectomy. He has follow up with Dr. Tresa Moore,  5/27 to further discuss his  options. We discussed his family history and he brought a copy of  his sister's genetic report to [facility] today.  He asked about moving forward with gold markers/Spaceoar gel. I discussed the purpose of these and told him his treatment decision, will determine if he needs these. After our discussion, I encouraged him to continue to consider his options and follow up with Dr. Tresa Moore to re discuss his options. He agrees, he needs to take his time and make the right decision. I gave him my office information and asked him to call me with questions or concerns. He was very appreciative of my time and thanked me for just listening.

## 2019-07-02 ENCOUNTER — Encounter: Payer: Self-pay | Admitting: Radiation Oncology

## 2019-07-02 ENCOUNTER — Telehealth: Payer: Self-pay | Admitting: *Deleted

## 2019-07-02 NOTE — Telephone Encounter (Signed)
Called patient and spoke with him, I told him that I have spoke with [facility] and they tell me that he will be in on May 27 to talk to Dr. Tresa Moore and finalize whether he will have fid. markers and space oar gel placement, patient verified understanding this

## 2019-07-02 NOTE — Progress Notes (Signed)
Received copy from patient of his sister's BRCA testing confirming mutation. Requested this information/paperwork be scan into the patient's EMR to allow provider access.

## 2019-07-17 ENCOUNTER — Encounter: Payer: Self-pay | Admitting: Radiation Oncology

## 2019-07-17 NOTE — Progress Notes (Signed)
Provided packet of Bettie Quebodeaux genetic test results identifying a clinical significant mutation on 06/10/2015. Ti Pelkey is the patient's sister.Told packet couldn't be scan into patient's EMR because the names don't match.

## 2019-08-20 ENCOUNTER — Other Ambulatory Visit: Payer: Self-pay | Admitting: Radiation Oncology

## 2019-08-20 ENCOUNTER — Other Ambulatory Visit (HOSPITAL_COMMUNITY): Payer: Self-pay | Admitting: Radiation Oncology

## 2019-08-20 ENCOUNTER — Other Ambulatory Visit: Payer: Self-pay | Admitting: *Deleted

## 2019-08-20 DIAGNOSIS — C61 Malignant neoplasm of prostate: Secondary | ICD-10-CM

## 2019-09-04 ENCOUNTER — Ambulatory Visit (HOSPITAL_COMMUNITY)
Admission: RE | Admit: 2019-09-04 | Discharge: 2019-09-04 | Disposition: A | Discharge status: Home / Self Care | Payer: 59 | Source: Ambulatory Visit | Attending: Radiation Oncology | Admitting: Radiation Oncology

## 2019-09-04 ENCOUNTER — Other Ambulatory Visit: Payer: Self-pay

## 2019-09-04 DIAGNOSIS — C61 Malignant neoplasm of prostate: Secondary | ICD-10-CM | POA: Insufficient documentation

## 2019-09-04 MED ORDER — GADOBUTROL 1 MMOL/ML IV SOLN
8.0000 mL | Freq: Once | INTRAVENOUS | Status: AC | PRN
  Administered 2019-09-04: 8 mL via INTRAVENOUS

## 2020-11-02 IMAGING — MR MR PROSTATE WO/W CM
49 of 56 series · 49 of 56 positions shown · IV contrast (y GAD)
Comparison: None.

CLINICAL DATA: Prostate cancer. PSA equal 3.02. Biopsy revealed
Gleason 3 + 4 equals 7 prostate adenocarcinoma.

EXAM:
MR PROSTATE WITHOUT AND WITH CONTRAST
TECHNIQUE: Multiplanar multisequence MRI images were obtained of the pelvis
centered about the prostate. Pre and post contrast images were
obtained.
CONTRAST:  8mL GADAVIST GADOBUTROL 1 MMOL/ML IV SOLN

[Series 3: T1 · axial · 8.0mm · 0.78mm/px · 1 of 28 slices shown (1 of 2)]
[im 1/28]
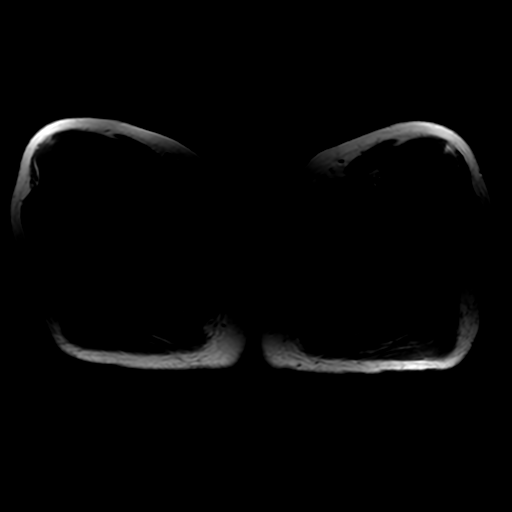

[Series 4: bSSFP fat-sat · axial · 8.0mm · 0.78mm/px · 1 of 28 slices shown]
[im 1/28]
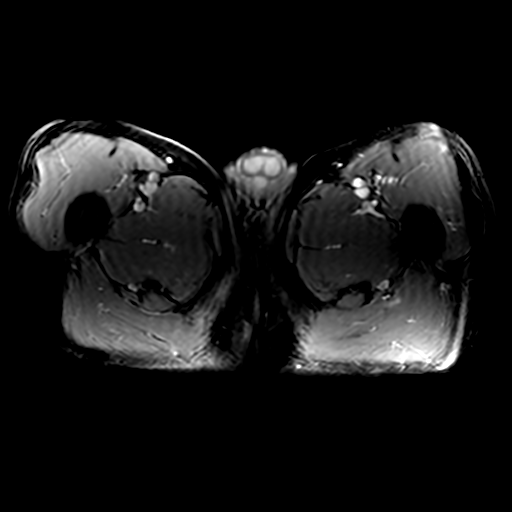

[Series 5: T2 · coronal · 3.0mm · 0.35mm/px · 1 of 26 slices shown (1 of 3)]
[im 1/26]
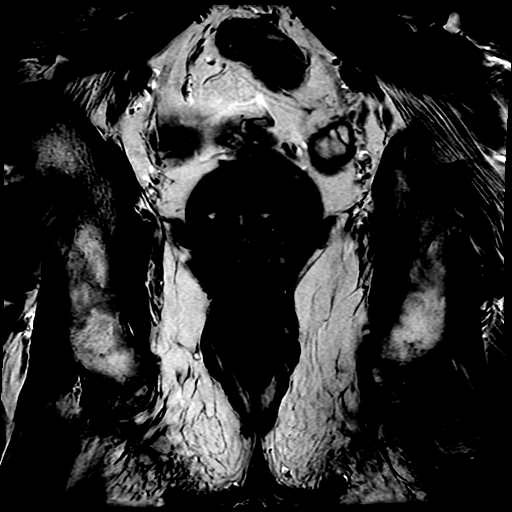

[Series 6: T2 · sagittal · 3.5mm · 0.31mm/px · 1 of 26 slices shown (2 of 3)]
[im 1/26]
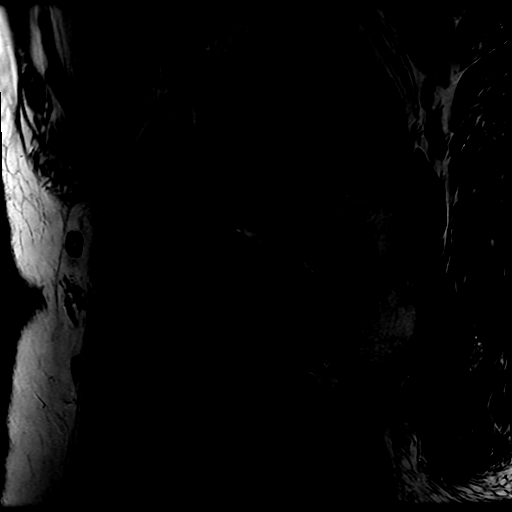

[Series 7: T1 · axial · 3.0mm · 0.35mm/px · 1 of 27 slices shown (2 of 2)]
[im 1/27]
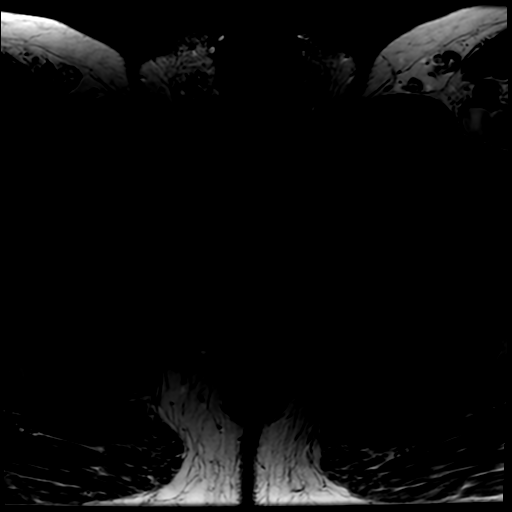

[Series 8: T2 · axial · 3.0mm · 0.35mm/px · 1 of 27 slices shown (3 of 3)]
[im 1/27]
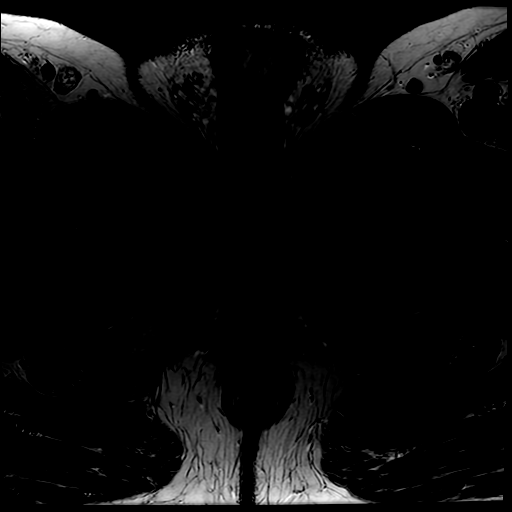

[Series 10: DWI · axial · 3.0mm · 0.78mm/px · 1 of 60 slices shown (1 of 2)]
[im 1/60]
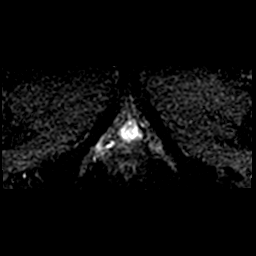

[Series 1000: DWI · axial · 3.0mm · 0.78mm/px · 1 of 20 slices shown (2 of 2)]
[im 1/20]
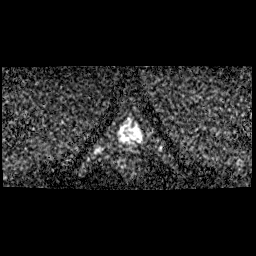

[Series 1050: ADC · axial · 3.0mm · 0.78mm/px · 1 of 20 slices shown]
[im 1/20]
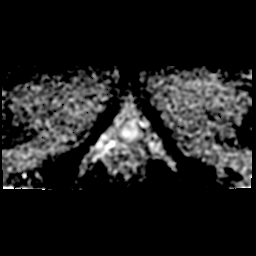

[Series 1100: T1 dynamic · axial · 4.0mm · 0.94mm/px · 1 of 26 slices shown (1 of 25)]
[im 1/26]
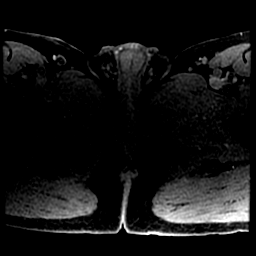

[Series 1101: T1 dynamic · axial · 4.0mm · 0.94mm/px · 1 of 26 slices shown (2 of 25)]
[im 1/26]
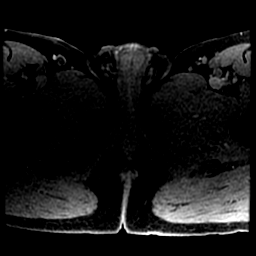

[Series 1102: T1 dynamic · axial · 4.0mm · 0.94mm/px · 1 of 26 slices shown (3 of 25)]
[im 1/26]
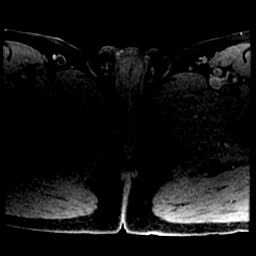

[Series 1103: T1 dynamic · axial · 4.0mm · 0.94mm/px · 1 of 26 slices shown (4 of 25)]
[im 1/26]
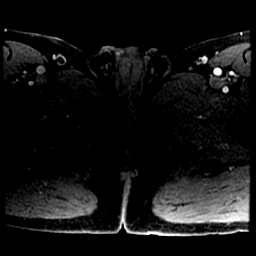

[Series 1104: T1 dynamic · axial · 4.0mm · 0.94mm/px · 1 of 26 slices shown (5 of 25)]
[im 1/26]
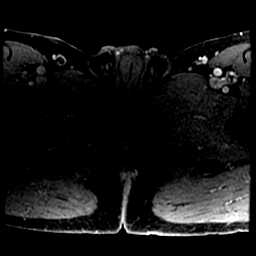

[Series 1105: T1 dynamic · axial · 4.0mm · 0.94mm/px · 1 of 26 slices shown (6 of 25)]
[im 1/26]
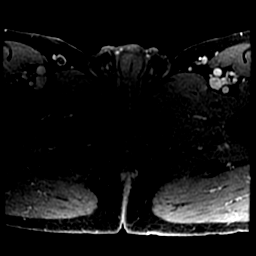

[Series 1106: T1 dynamic · axial · 4.0mm · 0.94mm/px · 1 of 26 slices shown (7 of 25)]
[im 1/26]
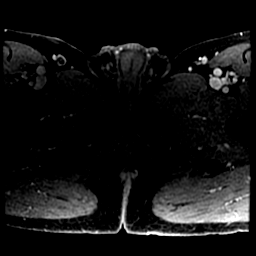

[Series 1107: T1 dynamic · axial · 4.0mm · 0.94mm/px · 1 of 26 slices shown (8 of 25)]
[im 1/26]
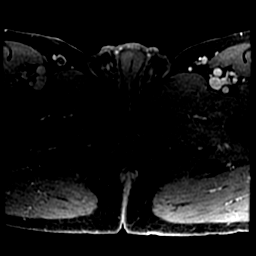

[Series 1108: T1 dynamic · axial · 4.0mm · 0.94mm/px · 1 of 26 slices shown (9 of 25)]
[im 1/26]
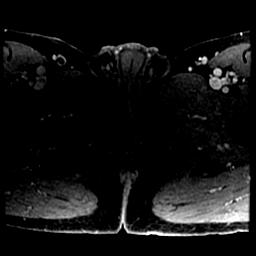

[Series 1109: T1 dynamic · axial · 4.0mm · 0.94mm/px · 1 of 26 slices shown (10 of 25)]
[im 1/26]
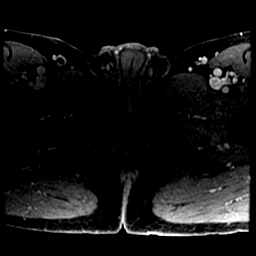

[Series 1110: T1 dynamic · axial · 4.0mm · 0.94mm/px · 1 of 26 slices shown (11 of 25)]
[im 1/26]
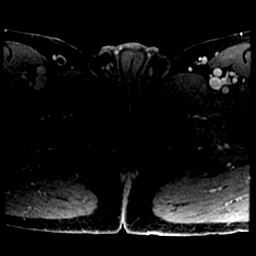

[Series 1111: T1 dynamic · axial · 4.0mm · 0.94mm/px · 1 of 26 slices shown (12 of 25)]
[im 1/26]
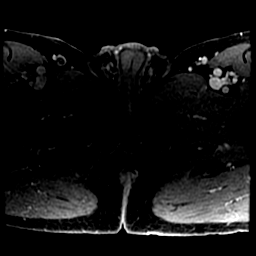

[Series 1112: T1 dynamic · axial · 4.0mm · 0.94mm/px · 1 of 26 slices shown (13 of 25)]
[im 1/26]
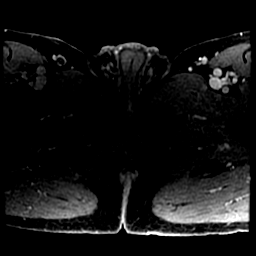

[Series 1113: T1 dynamic · axial · 4.0mm · 0.94mm/px · 1 of 26 slices shown (14 of 25)]
[im 1/26]
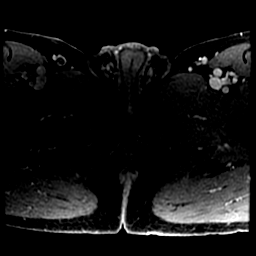

[Series 1114: T1 dynamic · axial · 4.0mm · 0.94mm/px · 1 of 26 slices shown (15 of 25)]
[im 1/26]
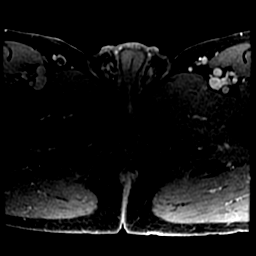

[Series 1115: T1 dynamic · axial · 4.0mm · 0.94mm/px · 1 of 26 slices shown (16 of 25)]
[im 1/26]
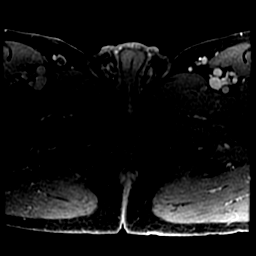

[Series 1116: T1 dynamic · axial · 4.0mm · 0.94mm/px · 1 of 26 slices shown (17 of 25)]
[im 1/26]
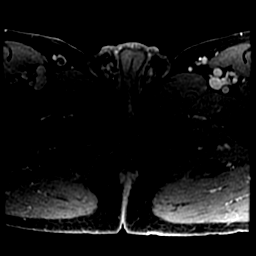

[Series 1117: T1 dynamic · axial · 4.0mm · 0.94mm/px · 1 of 26 slices shown (18 of 25)]
[im 1/26]
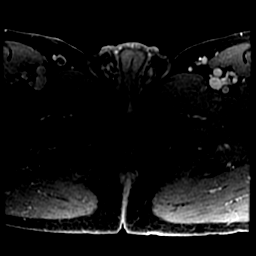

[Series 1118: T1 dynamic · axial · 4.0mm · 0.94mm/px · 1 of 26 slices shown (19 of 25)]
[im 1/26]
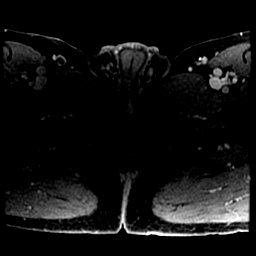

[Series 1119: T1 dynamic · axial · 4.0mm · 0.94mm/px · 1 of 26 slices shown (20 of 25)]
[im 1/26]
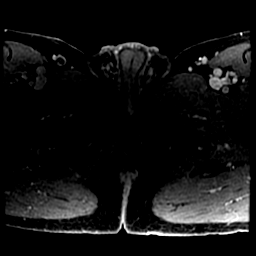

[Series 1120: T1 dynamic · axial · 4.0mm · 0.94mm/px · 1 of 26 slices shown (21 of 25)]
[im 1/26]
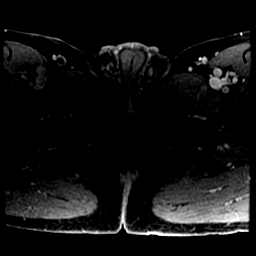

[Series 1121: T1 dynamic · axial · 4.0mm · 0.94mm/px · 1 of 26 slices shown (22 of 25)]
[im 1/26]
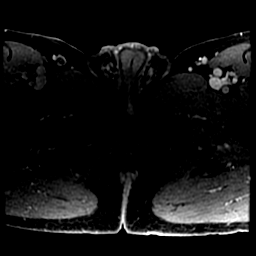

[Series 1122: T1 dynamic · axial · 4.0mm · 0.94mm/px · 1 of 26 slices shown (23 of 25)]
[im 1/26]
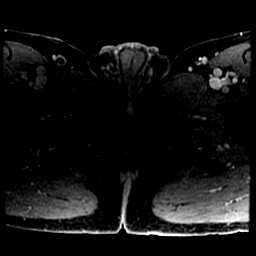

[Series 1123: T1 dynamic · axial · 4.0mm · 0.94mm/px · 1 of 26 slices shown (24 of 25)]
[im 1/26]
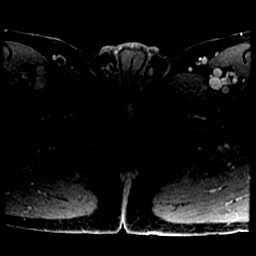

[Series 1124: T1 dynamic · axial · 4.0mm · 0.94mm/px · 1 of 26 slices shown (25 of 25)]
[im 1/26]
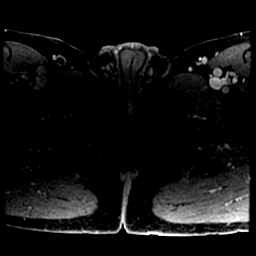

[((date))-((date)) · axial · 4.0mm · 0.94mm/px · 1 of 12 slices shown (1 of 15)]
[im 1/12]
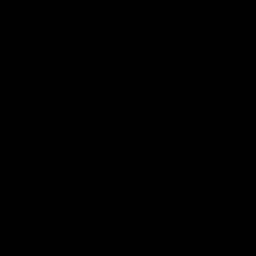

[((date))-((date)) · axial · 4.0mm · 0.94mm/px · 1 of 10 slices shown (2 of 15)]
[im 1/10]
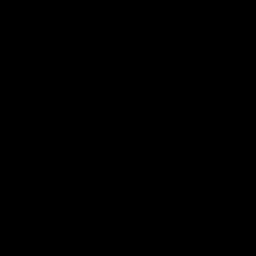

[((date))-((date)) · axial · 4.0mm · 0.94mm/px · 1 of 25 slices shown (3 of 15)]
[im 1/25]
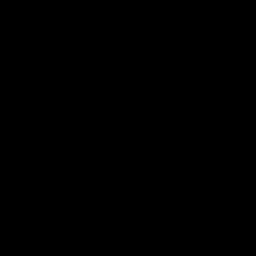

[((date))-((date)) · axial · 4.0mm · 0.94mm/px · 1 of 26 slices shown (4 of 15)]
[im 1/26]
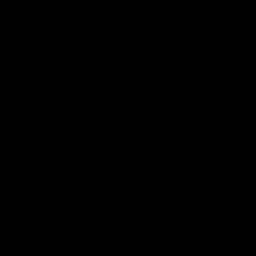

[((date))-((date)) · axial · 4.0mm · 0.94mm/px · 1 of 26 slices shown (5 of 15)]
[im 1/26]
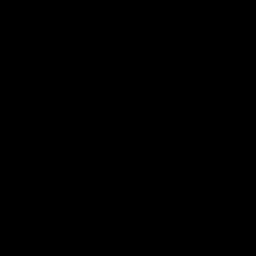

[((date))-((date)) · axial · 4.0mm · 0.94mm/px · 1 of 26 slices shown (6 of 15)]
[im 1/26]
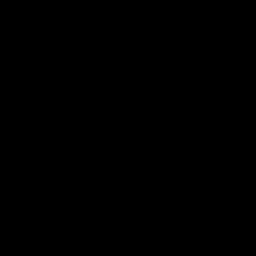

[((date))-((date)) · axial · 4.0mm · 0.94mm/px · 1 of 26 slices shown (7 of 15)]
[im 1/26]
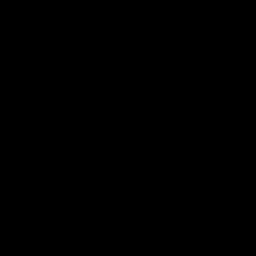

[((date))-((date)) · axial · 4.0mm · 0.94mm/px · 1 of 26 slices shown (8 of 15)]
[im 1/26]
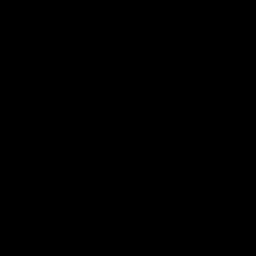

[((date))-((date)) · axial · 4.0mm · 0.94mm/px · 1 of 26 slices shown (9 of 15)]
[im 1/26]
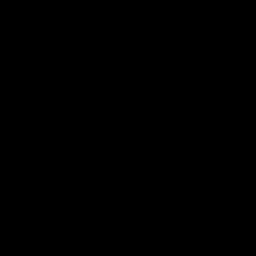

[((date))-((date)) · axial · 4.0mm · 0.94mm/px · 1 of 26 slices shown (10 of 15)]
[im 1/26]
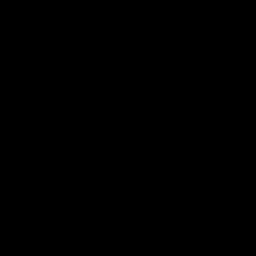

[((date))-((date)) · axial · 4.0mm · 0.94mm/px · 1 of 26 slices shown (11 of 15)]
[im 1/26]
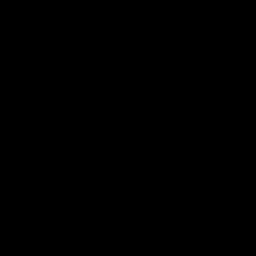

[((date))-((date)) · axial · 4.0mm · 0.94mm/px · 1 of 26 slices shown (12 of 15)]
[im 1/26]
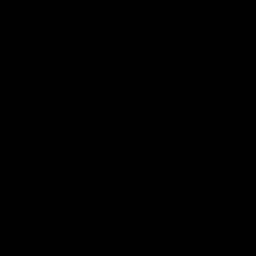

[((date))-((date)) · axial · 4.0mm · 0.94mm/px · 1 of 26 slices shown (13 of 15)]
[im 1/26]
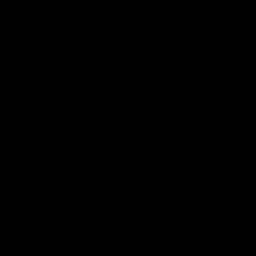

[((date))-((date)) · axial · 4.0mm · 0.94mm/px · 1 of 26 slices shown (14 of 15)]
[im 1/26]
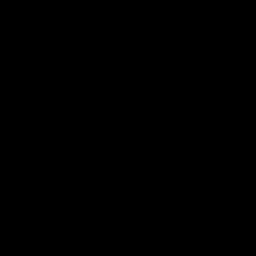

[((date))-((date)) · axial · 4.0mm · 0.94mm/px · 1 of 26 slices shown (15 of 15)]
[im 1/26]
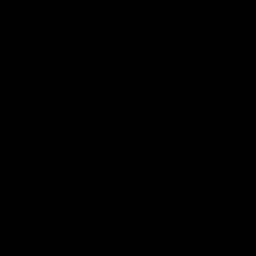

[49 of 56 positions shown; findings below may reference images not displayed]

FINDINGS: Prostate: Within the RIGHT base peripheral zone, there is a 1.1 x
0.8 cm focus of restricted diffusion (image 9/series 3272). No clear
additional foci of restricted diffusion.

RIGHT base lesion corresponds to a 1.1 x 0.4 cm lesion on T2
weighted imaging (image [DATE]).

There is heterogeneous signal intensity elsewhere within the
peripheral zone on T2 weighted imaging without corresponding
restricted diffusion.

The prostatic capsule is intact. There rectoprostatic angles are
sharp.

The transitional zone is small with capsulated nodules.

Volume: 4.5 x 2.8 x 4.8 cm (volume = 32 cm^3)

Transcapsular spread:  Absent

Seminal vesicle involvement: Absent

Neurovascular bundle involvement: Absent

Pelvic adenopathy: Absent

Bone metastasis: None

Other findings: None
IMPRESSION: 1. Lesion at the RIGHT base peripheral zone suspicious for
high-grade carcinoma ( PI-RADS: 4). Patient has known Gleason 3+4
adenocarcinoma.
2. Nodular transitional zone most consistent benign prostate
hypertrophy ( PI-RADS: 2)
3. Prostatic capsule intact.  Seminal vesicles normal.
